# Patient Record
Sex: Female | Born: 1990 | Race: Asian | Hispanic: No | Marital: Single
Health system: Southern US, Community
[De-identification: ages and names within clinical notes are randomized; demographics above are authoritative.]

---
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
## 2018-01-13 ENCOUNTER — Encounter (HOSPITAL_BASED_OUTPATIENT_CLINIC_OR_DEPARTMENT_OTHER): Payer: Self-pay | Admitting: Emergency Medicine

## 2018-01-13 ENCOUNTER — Other Ambulatory Visit: Payer: Self-pay

## 2018-01-13 ENCOUNTER — Emergency Department (HOSPITAL_BASED_OUTPATIENT_CLINIC_OR_DEPARTMENT_OTHER)
Admission: EM | Admit: 2018-01-13 | Discharge: 2018-01-13 | Disposition: A | Discharge status: Home / Self Care | Payer: 59 | Attending: Emergency Medicine | Admitting: Emergency Medicine

## 2018-01-13 DIAGNOSIS — R51 Headache: Secondary | ICD-10-CM | POA: Diagnosis present

## 2018-01-13 DIAGNOSIS — G971 Other reaction to spinal and lumbar puncture: Secondary | ICD-10-CM | POA: Insufficient documentation

## 2018-01-13 LAB — CBC WITH DIFFERENTIAL/PLATELET
BASOS PCT: 0 %
Basophils Absolute: 0 10*3/uL (ref 0.0–0.1)
EOS ABS: 0.1 10*3/uL (ref 0.0–0.7)
EOS PCT: 1 %
HCT: 38.9 % (ref 36.0–46.0)
HEMOGLOBIN: 12.7 g/dL (ref 12.0–15.0)
LYMPHS PCT: 24 %
Lymphs Abs: 1.9 10*3/uL (ref 0.7–4.0)
MCH: 29.4 pg (ref 26.0–34.0)
MCHC: 32.6 g/dL (ref 30.0–36.0)
MCV: 90 fL (ref 78.0–100.0)
Monocytes Absolute: 0.7 10*3/uL (ref 0.1–1.0)
Monocytes Relative: 9 %
NEUTROS PCT: 66 %
Neutro Abs: 5.2 10*3/uL (ref 1.7–7.7)
Platelets: 268 10*3/uL (ref 150–400)
RBC: 4.32 MIL/uL (ref 3.87–5.11)
RDW: 14 % (ref 11.5–15.5)
WBC: 7.9 10*3/uL (ref 4.0–10.5)

## 2018-01-13 LAB — BASIC METABOLIC PANEL
Anion gap: 6 (ref 5–15)
BUN: 13 mg/dL (ref 6–20)
CHLORIDE: 103 mmol/L (ref 101–111)
CO2: 29 mmol/L (ref 22–32)
Calcium: 8.6 mg/dL — ABNORMAL LOW (ref 8.9–10.3)
Creatinine, Ser: 0.79 mg/dL (ref 0.44–1.00)
GFR calc Af Amer: >60 mL/min (ref >60)
GFR calc non Af Amer: >60 mL/min (ref >60)
Glucose, Bld: 105 mg/dL — ABNORMAL HIGH (ref 65–99)
POTASSIUM: 3.8 mmol/L (ref 3.5–5.1)
SODIUM: 138 mmol/L (ref 135–145)

## 2018-01-13 MED ORDER — METOCLOPRAMIDE HCL 5 MG/ML IJ SOLN
10.0000 mg | Freq: Once | INTRAMUSCULAR | Status: AC
  Administered 2018-01-13: 10 mg via INTRAVENOUS
  Filled 2018-01-13: qty 2

## 2018-01-13 MED ORDER — KETOROLAC TROMETHAMINE 30 MG/ML IJ SOLN
15.0000 mg | Freq: Once | INTRAMUSCULAR | Status: AC
  Administered 2018-01-13: 15 mg via INTRAVENOUS
  Filled 2018-01-13: qty 1

## 2018-01-13 MED ORDER — DEXAMETHASONE SODIUM PHOSPHATE 10 MG/ML IJ SOLN
10.0000 mg | Freq: Once | INTRAMUSCULAR | Status: AC
  Administered 2018-01-13: 10 mg via INTRAVENOUS
  Filled 2018-01-13: qty 1

## 2018-01-13 MED ORDER — SODIUM CHLORIDE 0.9 % IV BOLUS (SEPSIS)
1000.0000 mL | Freq: Once | INTRAVENOUS | Status: AC
  Administered 2018-01-13: 1000 mL via INTRAVENOUS

## 2018-01-13 MED ORDER — DIPHENHYDRAMINE HCL 50 MG/ML IJ SOLN
25.0000 mg | Freq: Once | INTRAMUSCULAR | Status: AC
  Administered 2018-01-13: 25 mg via INTRAVENOUS
  Filled 2018-01-13: qty 1

## 2018-01-13 NOTE — Discharge Instructions (Signed)
Lay flat to keep headache controlled.  Take aleve or ibuprofen.  Drink plenty of fluids and caffiene.

## 2018-01-13 NOTE — ED Provider Notes (Signed)
[facility] EMERGENCY DEPARTMENT Provider Note   CSN: [id number] Arrival date & time: 01/13/18  1410     History   Chief Complaint Chief Complaint  Patient presents with  . Neck Pain  . Headache    HPI Meagan Kirk is a [age] y.o. female.  27 year old female with history of recently dx with viral meningitis after neg CSF by culture presenting with persistent HA.  She was discharged Wed and states she felt bad when she left and may feel worse now in the last 2 days she has been home.  She states that the HA is excruciating when standing and trying to walk.  She states it is a throbbing pressure and feels like her head is going to pop off.  Better if she lays down.  She denies any fever, unusual sensation in arms or legs but with standing she had some muffled hearing in her right ear today.  The hearing goes to normal with lying down.  Patient had all vaccines as a child.  She denies any abdominal pain.   The history is provided by the patient.    History reviewed. No pertinent past medical history.  There are no active problems to display for this patient.    OB History    No data available       Home Medications    Prior to Admission medications   Not on File    Family History History reviewed. No pertinent family history.  Social History Social History   Tobacco Use  . Smoking status: Not on file  Substance Use Topics  . Alcohol use: Not on file  . Drug use: Not on file     Allergies   Patient has no known allergies.   Review of Systems Review of Systems  All other systems reviewed and are negative.    Physical Exam Updated Vital Signs BP (!) 148/117   Pulse 76   Temp 98.3 F (36.8 C) (Oral)   Resp 20   Ht 5\' 6"  (1.676 m)   Wt 59 kg (130 lb)   LMP 12/30/2017 (Approximate)   SpO2 99%   BMI 20.98 kg/m   Physical Exam  Constitutional: She is oriented to person, place, and time. She appears well-developed and well-nourished.  No distress.  Pt lying flat and appear comfortable  HENT:  Head: Normocephalic and atraumatic.  Right Ear: Tympanic membrane normal.  Left Ear: Tympanic membrane normal.  Mouth/Throat: Oropharynx is clear and moist.  Eyes: Conjunctivae and EOM are normal. Pupils are equal, round, and reactive to light.  No nystagmus  Neck: Normal range of motion. Neck supple.  Some discomfort with neck flexion  Cardiovascular: Normal rate, regular rhythm and intact distal pulses.  No murmur heard. Pulmonary/Chest: Effort normal and breath sounds normal. No respiratory distress. She has no wheezes. She has no rales.  Abdominal: Soft. She exhibits no distension. There is no tenderness. There is no rebound and no guarding.  Musculoskeletal: Normal range of motion. She exhibits no edema or tenderness.  Neurological: She is alert and oriented to person, place, and time. She has normal strength. No cranial nerve deficit or sensory deficit. Coordination normal.  Skin: Skin is warm and dry. No rash noted. No erythema.  Psychiatric: She has a normal mood and affect. Her behavior is normal.  Nursing note and vitals reviewed.    ED Treatments / Results  Labs (all labs ordered are listed, but only abnormal results are displayed) Labs Reviewed  BASIC METABOLIC PANEL - Abnormal; Notable for the following components:      Result Value   Glucose, Bld 105 (*)    Calcium 8.6 (*)    All other components within normal limits  CBC WITH DIFFERENTIAL/PLATELET    EKG  EKG Interpretation None       Radiology No results found.  Procedures Procedures (including critical care time)  Medications Ordered in ED Medications  sodium chloride 0.9 % bolus 1,000 mL (not administered)  metoCLOPramide (REGLAN) injection 10 mg (not administered)  dexamethasone (DECADRON) injection 10 mg (not administered)  diphenhydrAMINE (BENADRYL) injection 25 mg (not administered)  ketorolac (TORADOL) 30 MG/ML injection 15 mg (not  administered)     Initial Impression / Assessment and Plan / ED Course  I have reviewed the triage vital signs and the nursing notes.  Pertinent labs & imaging results that were available during my care of the patient were reviewed by me and considered in my medical decision making (see chart for details).     Patient presenting today after recent dx of viral meningitis on Monday by LP with neg culture results.  Pt d/ced home on wed but states headache was as severe or worse when going home.  D/ced all abx.  Pt HA is very positional and worse with standing.  Pt had CT while at [facility] which was wnl.  She denies any fever or vomiting at this time.  As long as lying down she doesn't feel too bad but standing produces an excrutiating HA.  Concern at this time for post-LP headache.  She has normal neuro exam and denies any infectious sx at this time.  Pt VS with HTN however will recheck as concern this is an error.  Pt given IVF, headache cocktail and will re-eval.  Pt feeling a little better after IV therapy.   Feel she may benefit from a blood patch however unable to schedule that on a Friday night.  Pt is able to ambulate and non-toxic.  Discussed with Dr. Lacy Duverney with radiology and pt given a number to f/u if headache continues to be severe by Monday for blood patch.  Labs wnl and repeat BP wnl.  Final Clinical Impressions(s) / ED Diagnoses   Final diagnoses:  Post lumbar puncture headache    ED Discharge Orders    None       Gwyneth Sprout, MD 01/13/18 1711

## 2018-01-13 NOTE — ED Notes (Signed)
Pt verbalizes understanding of d/c instructions and denies any further need at this time. 

## 2018-01-13 NOTE — ED Notes (Signed)
Pt diagnosed with meningitis 4 days ago, has had a bad headache since prior to her admission to [facility] in [place], and the headache is worse with sitting upright.  Pt took two hydrocodone and 400 mg ibuprofen at 1000 today without relief.

## 2018-01-13 NOTE — ED Triage Notes (Signed)
Reports diagnosed with meningitis.  [facility].  Initially on abx but was told it is viral so all abx discontinued.  Presents to er for worsening head and neck pain.

## 2018-01-16 ENCOUNTER — Ambulatory Visit
Admission: RE | Admit: 2018-01-16 | Discharge: 2018-01-16 | Disposition: A | Discharge status: Home / Self Care | Payer: 59 | Source: Ambulatory Visit | Attending: Emergency Medicine | Admitting: Emergency Medicine

## 2018-01-16 ENCOUNTER — Other Ambulatory Visit: Payer: Self-pay | Admitting: Emergency Medicine

## 2018-01-16 DIAGNOSIS — G971 Other reaction to spinal and lumbar puncture: Secondary | ICD-10-CM

## 2018-01-16 MED ORDER — IOPAMIDOL (ISOVUE-M 200) INJECTION 41%
1.0000 mL | Freq: Once | INTRAMUSCULAR | Status: AC
  Administered 2018-01-16: 1 mL via EPIDURAL

## 2018-01-16 NOTE — Discharge Instructions (Signed)

## 2018-07-16 IMAGING — XA Imaging study
2 series · 2 of 2 positions shown · non-contrast
Comparison: none

CLINICAL DATA: Positional headaches following lumbar puncture at
[facility]OS[facility]] 1 week ago.

[Series 1: ortho standard · 1 of 1 slices shown (1 of 2)]
[im 1/1]
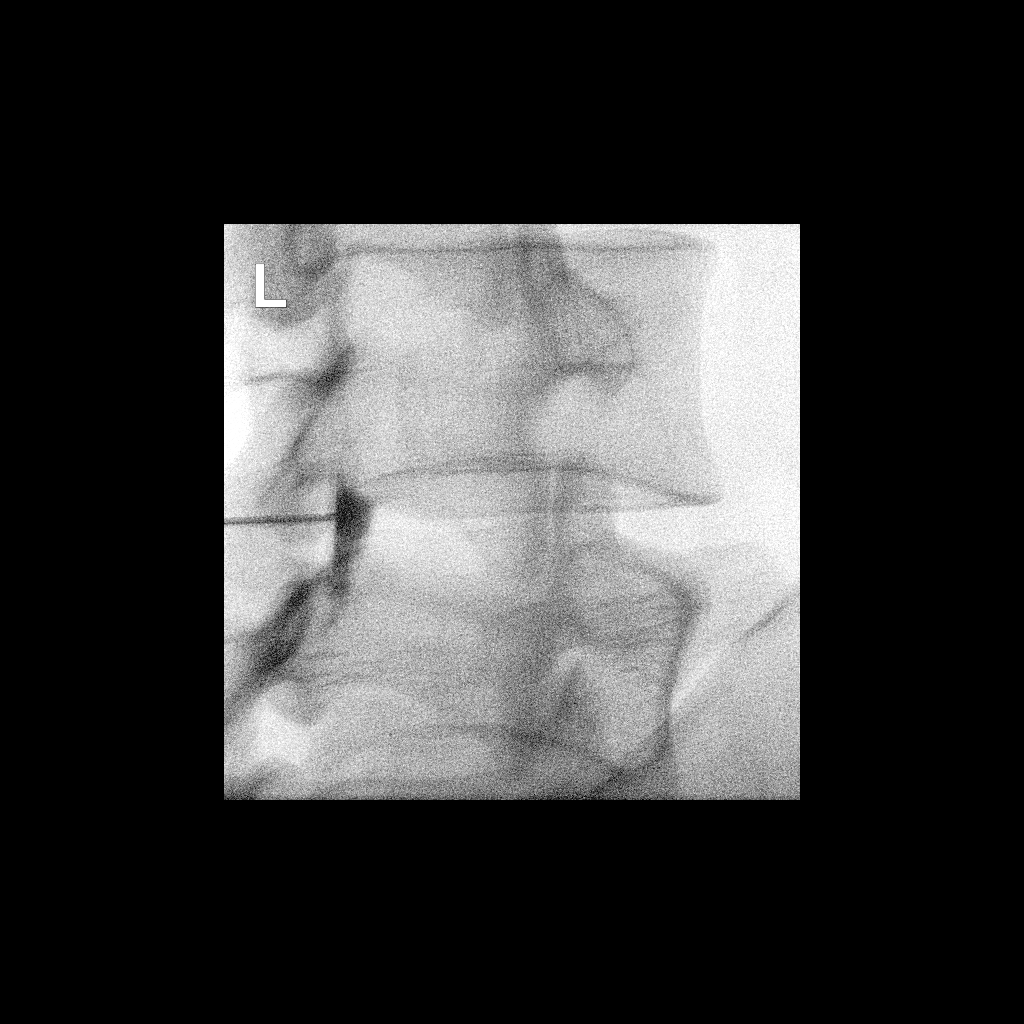

[Series 2: ortho standard · 1 of 1 slices shown (2 of 2)]
[im 1/1]
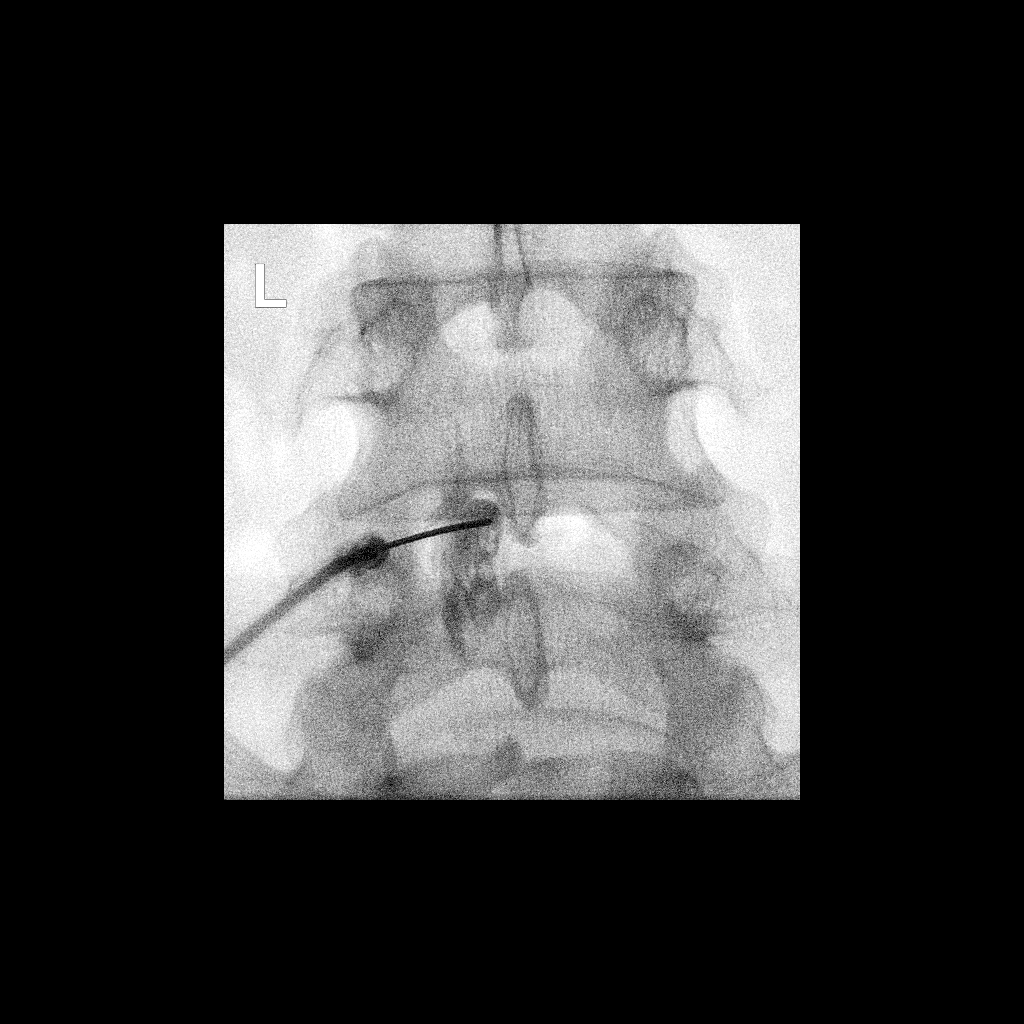

[2 of 2 positions shown; findings below may reference images not displayed]

FLUOROSCOPY TIME:  Radiation Exposure Index (as provided by the
fluoroscopic device): 5.27 uGy*m2

Fluoroscopy Time:  14 seconds

Number of Acquired Images:  0

PROCEDURE:
EPIDURAL BLOOD PATCH

The procedure, risks, benefits, and alternatives were explained to
the patient. Questions regarding the procedure were encouraged and
answered. The patient understands and consents to the procedure.

15 cc of blood were withdrawn from the patient's antecubital fossa.
An epidural approach was taken on left at L4-5 using a 20 gauge
epidural needle. Epidural positioning was confirmed by injecting a
small amount of Isovue-M 200. There was no vascular communication.
15 cc of the patient's blood was slowly injected into the epidural
space in this location. The procedure was well-tolerated and she was
discharged in good condition with instructions to lie down for
additional day.

COMPLICATIONS:
None
IMPRESSION: Lumbar epidural blood patch on the left at L4-5.

## 2022-08-30 ENCOUNTER — Ambulatory Visit (HOSPITAL_COMMUNITY)
Admission: EM | Admit: 2022-08-30 | Discharge: 2022-08-30 | Disposition: A | Discharge status: Home / Self Care | Payer: No Payment, Other | Attending: Family Medicine | Admitting: Family Medicine

## 2022-08-30 DIAGNOSIS — R413 Other amnesia: Secondary | ICD-10-CM | POA: Insufficient documentation

## 2022-08-30 DIAGNOSIS — F4323 Adjustment disorder with mixed anxiety and depressed mood: Secondary | ICD-10-CM | POA: Insufficient documentation

## 2022-08-30 NOTE — Discharge Instructions (Addendum)
Therapy Walk-in Hours  Monday-Wednesday: 8 AM until slots are full  Friday: 1 PM to 4 PM  For Monday to Wednesday, it is recommended that patients arrive between 7:30 AM and 7:45 AM because patients will be seen in the order of arrival.  For Friday, we ask that patients arrive between 12 PM to 12:30 PM.  Go to the second floor on arrival and check in.  **Availability is limited; therefore, patients may not be seen on the same day.**  Medication management walk-ins:  Monday to Friday: 8 AM to 11 AM.  It is recommended that patients arrive by 7:30 AM to 7:45 AM because patients will be seen in the order of arrival.  Go to the second floor on arrival and check in.  **Availability is limited; therefore, patients may not be seen on the same day.**

## 2022-08-30 NOTE — ED Provider Notes (Cosign Needed Addendum)
Behavioral Health Urgent Care Medical Screening Exam  Patient Name: Beverley Allender MRN: [id number] Date of Evaluation: 08/30/22 Chief Complaint:   Chief Complaint  Patient presents with   Depression  Diagnosis: Adjustment disorder with mixed anxiety and depressed mood  Final diagnoses:  Adjustment disorder with mixed anxiety and depressed mood   History of Present illness: Naarah Borgerding is a 31 y.o. female.   Weber Cooks 31 y.o., female patient presented voluntarily to [facility] as a walk in  accompanied by her father with complaints of poor memory, and feeling unsettled since quitting her job and moving back home with parents.    Weber Cooks, 36 y.o., female patient seen face to face by this provider, consulted with Dr. Lucianne Muss; and chart reviewed on 08/30/22.  On evaluation Kingsley Herandez reports a mental health history significant for ADHD, depression, and social anxiety. Patient presents today reporting a problem with loss of memory dating back to January 2023. She reports a fall after slipping on her kitchen floor which occurred in January and she attributes the fall to her poor  memory. She fell landing on the left side of face and did sustain a loss of consciousness nor did she require any medical follow-up after the fall. She subsequently abruptly quit her job in May and two week ago moved from [place] in which her parents. She states " I enjoyed my job and no one wanted me to leave, but I felt like it was too much pressure and too demanding, so I quit." She doesn't like living with her parents, but currently have limited resources. She is actively looking for a new job and has a goal of moving out of parents home by January. She is interested in establishing with a therapist as therapy has helped her in the past. She is currently uninsured although but routinely follows up with  her PCP, Dr. Donovan Kail, MD  for management of citalopram for depression and  anxiety and Adderall for ADHD. She denies SI/HI/AH/VH and any prior suicidal attempts or prior mental health admissions. She endorses use of Delta 8 approximately 2-3 times per week. Denies any other illicit drug or alcohol use. Endorses parental support and a few friends that reside in [place].  During evaluation Nelma Phagan is sitting upright in no acute distress.  She is alert, oriented x 4, calm, cooperative and attentive. Her mood is anxious, with a congruent affect.   She has normal speech, and behavior.  Objectively there is no evidence of psychosis/mania or delusional thinking.  Patient is able to converse coherently, goal directed thoughts, no distractibility, or pre-occupation.  She also denies suicidal/self-harm/homicidal ideation, psychosis, and paranoia.  Patient answered question appropriately.  Patient is able to contract for safety.  Psychiatric Specialty Exam  Presentation  General Appearance:Appropriate for Environment  Eye Contact:Fair  Speech:Normal Rate  Speech Volume:Normal  Handedness:No data recorded  Mood and Affect  Mood:Anxious  Affect:Congruent   Thought Process  Thought Processes:Goal Directed  Descriptions of Associations:No data recorded Orientation:Full (Time, Place and Person)  Thought Content:WDL    Hallucinations:None  Ideas of Reference:None  Suicidal Thoughts:No  Homicidal Thoughts:No   Sensorium  Memory:Immediate Good; Recent Good; Remote Good  Judgment:Good  Insight:Fair   Executive Functions  Concentration:Good  Attention Span:Good  Recall:Good  Fund of Knowledge:Good  Language:No data recorded  Psychomotor Activity  Psychomotor Activity:Normal   Assets  Assets:Communication Skills; Housing; Physical Health; Leisure Time; Social Support; Desire for Improvement   Sleep  Sleep:Fair  Number  of hours: 5   No data recorded  Physical Exam: Physical Exam Constitutional:      Appearance: Normal  appearance.  HENT:     Head: Normocephalic and atraumatic.  Eyes:     Extraocular Movements: Extraocular movements intact.     Conjunctiva/sclera: Conjunctivae normal.     Pupils: Pupils are equal, round, and reactive to light.  Musculoskeletal:     Cervical back: Normal range of motion.  Skin:    Capillary Refill: Capillary refill takes less than 2 seconds.  Neurological:     General: No focal deficit present.     Mental Status: She is alert.  Psychiatric:        Attention and Perception: Attention normal.        Mood and Affect: Mood is anxious.        Speech: Speech normal.        Behavior: Behavior is cooperative.        Thought Content: Thought content normal.        Judgment: Judgment normal.    Review of Systems  Constitutional: Negative.   Skin: Negative.   Neurological: Negative.   Psychiatric/Behavioral:  Positive for depression and memory loss. Negative for hallucinations, substance abuse and suicidal ideas. The patient is nervous/anxious. The patient does not have insomnia.    Blood pressure 131/87, pulse 93, temperature 98.5 F (36.9 C), temperature source Oral, resp. rate 16, height 5\' 6"  (1.676 m), weight 113 lb (51.3 kg), SpO2 100 %. Body mass index is 18.24 kg/m.  Musculoskeletal: Strength & Muscle Tone: within normal limits Gait & Station: normal Patient leans: N/A   [place] [facility] Discharge Disposition for Follow up and Recommendations: Based on my evaluation the patient does not appear to have an emergency medical condition and can be discharged with resources and follow up care in outpatient services for Individual Therapy and [place], FNP 08/30/2022, 6:11 PM

## 2022-08-30 NOTE — BH Assessment (Signed)
Pt reports she fell in January and since then she can not remember what she has been doing for the past 9 months. Patient was living in [place] and reports she left her job in May, moved in with her parents this month and is having worsening depression. Pt reports "hysterical crying" every night since moving in with her parents. Pt reports feeling "pressured" to get a job, move into her own place, get health insurance and take care of her mental health. Pt has been taking citalopram since college and she was receiving therapy services with [facility], however she is without outpatient services currently. Pt denies SI, HI, AVH and substance use.

## 2022-08-31 ENCOUNTER — Telehealth (HOSPITAL_COMMUNITY): Payer: Self-pay | Admitting: General Practice

## 2022-08-31 NOTE — BH Assessment (Signed)
Care Management - [facility] Follow Up Discharges  ° °Writer attempted to make contact with patient today and was unsuccessful.  Writer left a HIPPA compliant voice message.  ° °Per chart review, patient was provided with outpatient resources. ° °

## 2022-09-17 ENCOUNTER — Encounter (HOSPITAL_COMMUNITY): Payer: Self-pay | Admitting: Psychiatry

## 2022-09-17 ENCOUNTER — Ambulatory Visit (INDEPENDENT_AMBULATORY_CARE_PROVIDER_SITE_OTHER): Payer: No Payment, Other | Admitting: Psychiatry

## 2022-09-17 DIAGNOSIS — F411 Generalized anxiety disorder: Secondary | ICD-10-CM | POA: Diagnosis not present

## 2022-09-17 DIAGNOSIS — F32A Depression, unspecified: Secondary | ICD-10-CM

## 2022-09-17 MED ORDER — HYDROXYZINE HCL 10 MG PO TABS: 10.0000 mg | ORAL_TABLET | Freq: Three times a day (TID) | ORAL | 3 refills | Status: DC | PRN

## 2022-09-17 NOTE — Progress Notes (Signed)
Psychiatric Initial Adult Assessment  Virtual Visit via Video Note  I connected with Meagan Kirk on 09/17/22 at  8:00 AM EDT by a video enabled telemedicine application and verified that I am speaking with the correct person using two identifiers.  Location: Patient: Home Provider: Clinic   I discussed the limitations of evaluation and management by telemedicine and the availability of in person appointments. The patient expressed understanding and agreed to proceed.  I provided 40 minutes of non-face-to-face time during this encounter.   Patient Identification: Meagan Kirk MRN:  [id number] Date of Evaluation:  09/17/2022 Referral Source: [facility] Chief Complaint:  "I have been having problems sleeping" Visit Diagnosis:    ICD-10-CM   1. Generalized anxiety disorder  F41.1 hydrOXYzine (ATARAX) 10 MG tablet    2. Mild depression  F32.A       History of Present Illness:  31 year old female seen today for initial psychiatric evaluation.  She was recently seen at [facility] on 9/18 where she presented with symptoms of adjustment disorder.  She has a psychiatric history of adjustment disorder, anxiety, depression, and ADHD. She is currently managed on citalopram 40 mg and Adderall immediate release 30 mg twice daily mg. She notes her medications are somewhat effective in manage her psychiatric conditions.   Today she was well-groomed, pleasant, cooperative, engaged in conversation, and maintained eye contact.  She informed Clinical research associate that she has been having problems sleeping.  She notes that she sleeps approximately 4 hours nightly.  To cope with poor sleep patient notes that she utilize CBD products.  Patient notes that her lack of sleep is due to life stressors.  She reports in May she left her job because it was too demanding and now lives with her parents.  Patient notes that she worries about finding a new job and getting into the groove of where she once was.  She notes that she  worries about her finances and health as well.  Patient notes in social settings she becomes really anxious.  Today provider conducted a GAD-7 and patient scored an 18.  Provider also conducted PHQ-9 and patient scored 11.  She endorsed reduced appetite and notes that she is lost 15 pounds in the last 3 months.  Today she denies SI/HI/AVH, mania, or paranoia.  Patient informed writer that in January she fell and hit her head.  Since this time she notes that she has been having problems with her memory.  Provider tested patient's current memory and orientation and she was alert and oriented x4.  Patient notes at times she forgets the days of the week.  Provider informed patient that lack of sleep and poor nutrition can contribute to poor concentration and memory.  She endorsed understanding and agreed.  Provider informed patient that if memory does not improve with improved sleep and nutrition she can be referred to neurology.  She endorsed understanding and agreed.  Provider discussed Hetlioz, trazodone, and hydroxyzine to help manage sleep/anxiety.  Patient notes that she prefers hydroxyzine and is agreeable to starting hydroxyzine 10 mg 3 times daily to help manage sleep.  She will continue citalopram and Adderall as prescribed.  These medications will continue to be prescribed by her PCP. Potential side effects of medication and risks vs benefits of treatment vs non-treatment were explained and discussed. All questions were answered.    Associated Signs/Symptoms: Depression Symptoms:  depressed mood, insomnia, feelings of worthlessness/guilt, difficulty concentrating, impaired memory, anxiety, loss of energy/fatigue, disturbed sleep, weight loss, decreased appetite, (  Hypo) Manic Symptoms:  Elevated Mood, Irritable Mood, Anxiety Symptoms:  Excessive Worry, Psychotic Symptoms:   Denies PTSD Symptoms: NA  Past Psychiatric History: Na  Previous Psychotropic Medications:  Citalopram,  Adderall, Prozac, Zoloft  Substance Abuse History in the last 12 months:  No.  Consequences of Substance Abuse: NA  Past Medical History: History reviewed. No pertinent past medical history.   Family Psychiatric History: Denies  Family History: History reviewed. No pertinent family history.  Social History:   Social History   Socioeconomic History   Marital status: Single    Spouse name: Not on file   Number of children: Not on file   Years of education: Not on file   Highest education level: Not on file  Occupational History   Not on file  Tobacco Use   Smoking status: Not on file   Smokeless tobacco: Not on file  Substance and Sexual Activity   Alcohol use: Not on file   Drug use: Not on file   Sexual activity: Not on file  Other Topics Concern   Not on file  Social History Narrative   Not on file   Social Determinants of Health   Financial Resource Strain: Not on file  Food Insecurity: Not on file  Transportation Needs: Not on file  Physical Activity: Not on file  Stress: Not on file  Social Connections: Not on file    Additional Social History: Patient resides in [place] with her parents. She is single and has no children. She uses CBD but denies illegal drug use, tobacco, or substance use.  Allergies:  No Known Allergies  Metabolic Disorder Labs: No results found for: "HGBA1C", "MPG" No results found for: "PROLACTIN" No results found for: "CHOL", "TRIG", "HDL", "CHOLHDL", "VLDL", "LDLCALC" No results found for: "TSH"  Therapeutic Level Labs: No results found for: "LITHIUM" No results found for: "CBMZ" No results found for: "VALPROATE"  Current Medications: Current Outpatient Medications  Medication Sig Dispense Refill   hydrOXYzine (ATARAX) 10 MG tablet Take 1 tablet (10 mg total) by mouth 3 (three) times daily as needed. 90 tablet 3   No current facility-administered medications for this visit.    Musculoskeletal: Strength & Muscle Tone:  within normal limits and Telehealth visit Gait & Station: normal, Telehealth visit Patient leans: N/A  Psychiatric Specialty Exam: Review of Systems  There were no vitals taken for this visit.There is no height or weight on file to calculate BMI.  General Appearance: Well Groomed  Eye Contact:  Good  Speech:  Clear and Coherent and Normal Rate  Volume:  Normal  Mood:  Full (Time, Place, and Person)  Affect:  Appropriate  Thought Process:  Coherent, Goal Directed, and Linear  Orientation:  Full (Time, Place, and Person)  Thought Content:  WDL and Logical  Suicidal Thoughts:  No  Homicidal Thoughts:  No  Memory:  Immediate;   Fair Recent;   Fair Remote;   Fair  Judgement:  Good  Insight:  Good  Psychomotor Activity:  Normal  Concentration:  Concentration: Good and Attention Span: Good  Recall:  Good  Fund of Knowledge:Good  Language: Good  Akathisia:  No  Handed:  Ambidextrous  AIMS (if indicated):  not done  Assets:  Communication Skills Desire for Improvement Financial Resources/Insurance Housing Leisure Time Physical Health Social Support  ADL's:  Intact  Cognition: WNL  Sleep:  Poor   Screenings: GAD-7    Flowsheet Row Office Visit from [facility]/17/20[facility] in [facility]  Total GAD-7 Score 18      PHQ2-9    [place] Office Visit from 09/17/2022 in [facility]  PHQ-2 Total Score 4  PHQ-9 Total Score 11       Assessment and Plan: Patient endorses symptoms of anxiety, depression, and insomnia.  Today she is agreeable to starting hydroxyzine 10 mg 3 times daily to help manage anxiety and sleep.  Patient instructed to take 2 hydroxyzine nightly to help with sleep.  She endorsed understanding and agreed.  She will continue citalopram and Adderall as prescribed.  Adderall and citalopram will continue to be prescribed by her PCP.  1. Generalized anxiety disorder  Start- hydrOXYzine (ATARAX) 10 MG tablet; Take  1 tablet (10 mg total) by mouth 3 (three) times daily as needed.  Dispense: 90 tablet; Refill: 3  2. Mild depression    Collaboration of Care: Other provider involved in patient's care AEB PCP  Patient/Guardian was advised Release of Information must be obtained prior to any record release in order to collaborate their care with an outside provider. Patient/Guardian was advised if they have not already done so to contact the registration department to sign all necessary forms in order for [place] to release information regarding their care.   Consent: Patient/Guardian gives verbal consent for treatment and assignment of benefits for services provided during this visit. Patient/Guardian expressed understanding and agreed to proceed.   Follow-up in 3 months  Salley Slaughter, NP 10/6/20238:38 AM

## 2022-10-08 ENCOUNTER — Ambulatory Visit (INDEPENDENT_AMBULATORY_CARE_PROVIDER_SITE_OTHER): Payer: No Payment, Other | Admitting: Licensed Clinical Social Worker

## 2022-10-08 DIAGNOSIS — F411 Generalized anxiety disorder: Secondary | ICD-10-CM | POA: Diagnosis not present

## 2022-10-10 NOTE — Progress Notes (Signed)
Comprehensive Clinical Assessment (CCA) Note  10/08/2022 Ceretha Lenth [id number]  Chief Complaint:  Chief Complaint  Patient presents with   Anxiety    Laronda reports her primary anxiety triggers are surrounding her not having a job and looking for employment and feeling pressured by her mother.   Visit Diagnosis: Generalized Anxiety Disorder    CCA Screening, Triage and Referral (STR)  Patient Reported Information How did you hear about [place]? Family/Friend  Referral name: No data recorded Referral phone number: No data recorded  Whom do you see for routine medical problems? No data recorded Practice/Facility Name: No data recorded Practice/Facility Phone Number: No data recorded Name of Contact: No data recorded Contact Number: No data recorded Contact Fax Number: No data recorded Prescriber Name: No data recorded Prescriber Address (if known): No data recorded  What Is the Reason for Your Visit/Call Today? Worsening depression past few months. Denies SI, HI, AVH  How Long Has This Been Causing You Problems? 1-6 months  What Do You Feel Would Help You the Most Today? Treatment for Depression or other mood problem   Have You Recently Been in Any Inpatient Treatment (Hospital/Detox/Crisis Center/28-Day Program)? No data recorded Name/Location of Program/Hospital:No data recorded How Long Were You There? No data recorded When Were You Discharged? No data recorded  Have You Ever Received Services From [facility] Before? No data recorded Who Do You See at [facility]? No data recorded  Have You Recently Had Any Thoughts About Hurting Yourself? No  Are You Planning to Commit Suicide/Harm Yourself At This time? No   Have you Recently Had Thoughts About [place]? No  Explanation: No data recorded  Have You Used Any Alcohol or Drugs in the Past 24 Hours? No  How Long Ago Did You Use Drugs or Alcohol? No data recorded What Did You Use and How Much? No  data recorded  Do You Currently Have a Therapist/Psychiatrist? No data recorded Name of Therapist/Psychiatrist: No data recorded  Have You Been Recently Discharged From Any Office Practice or Programs? No data recorded Explanation of Discharge From Practice/Program: No data recorded    CCA Screening Triage Referral Assessment Type of Contact: No data recorded Is this Initial or Reassessment? No data recorded Date Telepsych consult ordered in [facility]:  No data recorded Time Telepsych consult ordered in [facility]:  No data recorded  Patient Reported Information Reviewed? No data recorded Patient Left Without Being Seen? No data recorded Reason for Not Completing Assessment: No data recorded  Collateral Involvement: No data recorded  Does Patient Have a [place]? No data recorded Name and Contact of Legal Guardian: No data recorded If Minor and Not Living with Parent(s), Who has Custody? No data recorded Is CPS involved or ever been involved? No data recorded Is APS involved or ever been involved? No data recorded  Patient Determined To Be At Risk for Harm To Self or Others Based on Review of Patient Reported Information or Presenting Complaint? No data recorded Method: No data recorded Availability of Means: No data recorded Intent: No data recorded Notification Required: No data recorded Additional Information for Danger to Others Potential: No data recorded Additional Comments for Danger to Others Potential: No data recorded Are There Guns or Other Weapons in Your Home? No data recorded Types of Guns/Weapons: No data recorded Are These Weapons Safely Secured?  No data recorded Who Could Verify You Are Able To Have These Secured: No data recorded Do You Have any Outstanding Charges, Pending Court Dates, Parole/Probation? No data recorded Contacted To Inform of Risk of Harm To Self or Others: No data recorded  Location of Assessment: No  data recorded  Does Patient Present under Involuntary Commitment? No data recorded IVC Papers Initial File Date: No data recorded  [place] of Residence: No data recorded  Patient Currently Receiving the Following Services: No data recorded  Determination of Need: Routine (7 days)   Options For Referral: Medication Management; Outpatient Therapy     CCA Biopsychosocial Intake/Chief Complaint:  Raiya reports that she has been dealing with increased anxiety since leaving job in May of 2023.  Current Symptoms/Problems: She reports that she has a lot of stress and overall unhappiness and interviewing for a new job. 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 She reports that she does not have any schedule or routine currently. Riddhi reports that keeping quite and to herself helps her not have any outbursts. She reports that she has tried to go to the gym and has a hard time getting the nerve to go to the gym. She reports that she has only been once on a 21 day pass. She reports arts and crafts is another coping skill she uses.   Patient Reported Schizophrenia/Schizoaffective Diagnosis in Past: No   Strengths: Good listener, takes great instructions, fast learner  Preferences: Ok with talking to a therapist once a week.  Abilities: Consulting, sales, talking to others   Type of Services Patient Feels are Needed: Indivudal therapy   Initial Clinical Notes/Concerns: No data recorded  Mental Health Symptoms Depression:   None   Duration of Depressive symptoms: No data recorded  Mania:   None   Anxiety:    Difficulty concentrating; Fatigue; Irritability; Sleep; Tension; Worrying   Psychosis:   None   Duration of Psychotic symptoms: No data recorded  Trauma:   None   Obsessions:   None   Compulsions:   None   Inattention:   None   Hyperactivity/Impulsivity:   None   Oppositional/Defiant Behaviors:   None   Emotional Irregularity:   Mood lability    Other Mood/Personality Symptoms:  No data recorded   Mental Status Exam Appearance and self-care  Stature:   Small   Weight:   Average weight   Clothing:   Casual   Grooming:   Normal   Cosmetic use:   None   Posture/gait:   Normal   Motor activity:   Not Remarkable   Sensorium  Attention:   Normal   Concentration:   Normal   Orientation:   X5   Recall/memory:   Normal   Affect and Mood  Affect:   Appropriate; Labile   Mood:   Irritable; Anxious   Relating  Eye contact:   Normal   Facial expression:   Responsive   Attitude toward examiner:   Cooperative   Thought and Language  Speech flow:  Clear and Coherent   Thought content:   Appropriate to Mood and Circumstances   Preoccupation:   None   Hallucinations:   None   Organization:  No data recorded  [employer] of Knowledge:   Good   Intelligence:   Average   Abstraction:   Normal   Judgement:   Fair   Reality Testing:   Realistic   Insight:   Good   Decision Making:  Normal   Social Functioning  Social Maturity:   Isolates   Social Judgement:   Normal   Stress  Stressors:   Museum/gallery curator; 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; Work   Coping Ability:   Programme researcher, broadcasting/film/video Deficits:   Interpersonal; Self-care; Communication   Supports:   Support needed; Family     Religion: Religion/Spirituality Are You A Religious Person?: No  Leisure/Recreation: Leisure / Recreation Do You Have Hobbies?: No  Exercise/Diet: Exercise/Diet Do You Exercise?: No Have You Gained or Lost A Significant Amount of Weight in the Past Six Months?: No Do You Follow a Special Diet?: No Do You Have Any Trouble Sleeping?: Yes Explanation of Sleeping Difficulties: falling asleep and staying asleep   CCA Employment/Education Employment/Work Situation: Employment / Work Situation Employment Situation: Unemployed Patient's Job has Been Impacted by Current Illness: No What is  the Longest Time Patient has Held a Job?: 5 years 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 Has Patient ever Been in the [employer]?: No  Education: Education Is Patient Currently Attending School?: No Did Teacher, adult education From [employer]?: Yes Did Physicist, medical?: Yes What Type of College Degree Do you Have?: Bachelors degree Did You Have An Individualized Education Program (IIEP): No Did You Have Any Difficulty At School?: No Patient's Education Has Been Impacted by Current Illness: No   CCA Family/Childhood History Family and Relationship History: Family history Marital status: Single Does patient have children?: No  Childhood History:  Childhood History By whom was/is the patient raised?: Both parents Additional childhood history information: Charolotte reports that she does not really remember her childhood. She reports that she does not care to remember.  Child/Adolescent Assessment:     CCA Substance Use Alcohol/Drug Use: Alcohol / Drug Use Prescriptions: Citalapram, Adderall, Birth control History of alcohol / drug use?: No history of alcohol / drug abuse Longest period of sobriety (when/how long): No past alcohol or drug abuse history Withdrawal Symptoms: None                         ASAM's:  Six Dimensions of Multidimensional Assessment  Dimension 1:  Acute Intoxication and/or Withdrawal Potential:  None    Dimension 2:  Biomedical Conditions and Complications:    None  Dimension 3:  Emotional, Behavioral, or Cognitive Conditions and Complications:   Anxiety  Dimension 4:  Readiness to Change:   Preparation  Dimension 5:  Relapse, Continued use, or Continued Problem Potential:   None  Dimension 6:  Recovery/Living Environment:   Supportive  ASAM Severity Score:    ASAM Recommended Level of Treatment:     Substance use Disorder (SUD)    Recommendations for Services/Supports/Treatments:    DSM5 Diagnoses: Patient Active Problem  List   Diagnosis Date Noted   Generalized anxiety disorder 09/17/2022   Mild depression 09/17/2022    Patient Centered Plan: Patient is on the following Treatment Plan(s):  Anxiety  Sephra is a 31 y/o [place] female presenting for assessment. She reports that she has a lot of stress and overall unhappiness and interviewing for a new job. 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 She reports that she does not have any schedule or routine currently. Toniyah reports that keeping quite and to herself helps her not have any outbursts. She reports that she has tried to go to the gym and has a hard time getting the nerve to go to the gym. She reports that she has only  been once on a 21 day pass. She reports arts and crafts is another coping skill she uses.  Tyechia is recommended for individual therapy and medication management. She is agreeable to this.  Referrals to Alternative Service(s): Referred to Alternative Service(s):   Place:   Date:   Time:    Referred to Alternative Service(s):   Place:   Date:   Time:    Referred to Alternative Service(s):   Place:   Date:   Time:    Referred to Alternative Service(s):   Place:   Date:   Time:      Collaboration of Care: Medication Management AEB    Patient/Guardian was advised Release of Information must be obtained prior to any record release in order to collaborate their care with an outside provider. Patient/Guardian was advised if they have not already done so to contact the registration department to sign all necessary forms in order for [place] to release information regarding their care.   Consent: Patient/Guardian gives verbal consent for treatment and assignment of benefits for services provided during this visit. Patient/Guardian expressed understanding and agreed to proceed.   Allyson Sabal, [facility]

## 2022-10-21 ENCOUNTER — Ambulatory Visit (HOSPITAL_COMMUNITY): Payer: No Payment, Other | Admitting: Licensed Clinical Social Worker

## 2022-12-14 ENCOUNTER — Encounter (HOSPITAL_COMMUNITY): Payer: Self-pay | Admitting: Student in an Organized Health Care Education/Training Program

## 2022-12-14 ENCOUNTER — Telehealth (INDEPENDENT_AMBULATORY_CARE_PROVIDER_SITE_OTHER): Payer: Medicaid Other | Admitting: Student in an Organized Health Care Education/Training Program

## 2022-12-14 DIAGNOSIS — F331 Major depressive disorder, recurrent, moderate: Secondary | ICD-10-CM | POA: Diagnosis not present

## 2022-12-14 DIAGNOSIS — F411 Generalized anxiety disorder: Secondary | ICD-10-CM

## 2022-12-14 MED ORDER — DULOXETINE HCL 30 MG PO CPEP: 30.0000 mg | ORAL_CAPSULE | Freq: Every day | ORAL | 0 refills | Status: AC

## 2022-12-14 MED ORDER — DULOXETINE HCL 60 MG PO CPEP: 60.0000 mg | ORAL_CAPSULE | Freq: Every day | ORAL | 2 refills | Status: DC

## 2022-12-14 MED ORDER — HYDROXYZINE HCL 10 MG PO TABS: 10.0000 mg | ORAL_TABLET | Freq: Three times a day (TID) | ORAL | 3 refills | Status: AC | PRN

## 2022-12-14 NOTE — Progress Notes (Signed)
BH MD/PA/NP OP Progress Note  12/14/2022 1:59 PM Meagan Kirk  MRN:  [id number]  Chief Complaint:  Chief Complaint  Patient presents with   Follow-up  Virtual Visit via Video Note  I connected with Weber Cooks on 12/14/22 at  1:30 PM EST by a video enabled telemedicine application and verified that I am speaking with the correct person using two identifiers.  Location: Patient: Home Provider: Office   I discussed the limitations of evaluation and management by telemedicine and the availability of in person appointments. The patient expressed understanding and agreed to proceed.  History of Present Illness: Meagan Kirk is a 32 yo patient with PPH of MDD, GAD, and ADHD. Patient received her Adderall 30mg  BID from her PCP and is prescribed Celexa 40mg  and Hydroxyzine 10mg  TID PRN.   Patient reports that she has been compliant with meds. Patient reports that she continues to feel flat and depressed, she does think that the hydroxyzine has been helping with sleep.  Patient reports that she is not able to take the hydroxyzine much during the day because it does sedate her.  Patient reports that she has been unemployed since last 04/2021 and has been dealing with significant anhedonia. Patient reports that she is dealing with a lot of hopelessness.  Patient reports that overall she feels her confidence is extremely low.  Patient reports that she still has an appetite and no big change in weight, she is trying to go to the gym.  Patient reports that going to the gym often takes great effort, and she will often find herself sitting outside in her car for a long period of time before finally making it in.  Patient denies SI but endorses feeling worthlessness. Patient denies HI and AVH.   Patient reports that she feels constantly on edge. Patient reports that before hydroxyzine this was also contributing to her poor sleep. Patient reports that she continues to feel very irritable and  sensitive.    Objectively, patient does endorse limited thought process, and struggles with her own expectations for herself and has a lot of "should" ideas and cognitive distortion.  Patient does not appear to meet critieria for mania or hypomania.     I discussed the assessment and treatment plan with the patient. The patient was provided an opportunity to ask questions and all were answered. The patient agreed with the plan and demonstrated an understanding of the instructions.   The patient was advised to call back or seek an in-person evaluation if the symptoms worsen or if the condition fails to improve as anticipated.  I provided 30 minutes of non-face-to-face time during this encounter.   , MD  Visit Diagnosis:    ICD-10-CM   1. Moderate episode of recurrent major depressive disorder (HCC)  F33.1 DULoxetine (CYMBALTA) 30 MG capsule    DULoxetine (CYMBALTA) 60 MG capsule    hydrOXYzine (ATARAX) 10 MG tablet    2. Generalized anxiety disorder  F41.1 DULoxetine (CYMBALTA) 30 MG capsule    DULoxetine (CYMBALTA) 60 MG capsule    hydrOXYzine (ATARAX) 10 MG tablet      Past Psychiatric History:   Hx of Zoloft, Prozac (liked it, pooped out)Wellbutrin (not helpful), buspar (not helpful)  Past Medical History: No past medical history on file. No past surgical history on file.  Family Psychiatric History: See initial visit  Family History: No family history on file.  Social History:  Social History   Socioeconomic History   Marital status: Single  Spouse name: Not on file   Number of children: Not on file   Years of education: Not on file   Highest education level: Not on file  Occupational History   Not on file  Tobacco Use   Smoking status: Not on file   Smokeless tobacco: Not on file  Substance and Sexual Activity   Alcohol use: Not on file   Drug use: Not on file   Sexual activity: Not on file  Other Topics Concern   Not on file  Social  History Narrative   Not on file   Social Determinants of Health   Financial Resource Strain: Not on file  Food Insecurity: Not on file  Transportation Needs: Not on file  Physical Activity: Not on file  Stress: Not on file  Social Connections: Not on file    Allergies: No Known Allergies  Metabolic Disorder Labs: No results found for: "HGBA1C", "MPG" No results found for: "PROLACTIN" No results found for: "CHOL", "TRIG", "HDL", "CHOLHDL", "VLDL", "LDLCALC" No results found for: "TSH"  Therapeutic Level Labs: No results found for: "LITHIUM" No results found for: "VALPROATE" No results found for: "CBMZ"  Current Medications: Current Outpatient Medications  Medication Sig Dispense Refill   DULoxetine (CYMBALTA) 30 MG capsule Take 1 capsule (30 mg total) by mouth daily. 7 capsule 0   DULoxetine (CYMBALTA) 60 MG capsule Take 1 capsule (60 mg total) by mouth daily. 30 capsule 2   hydrOXYzine (ATARAX) 10 MG tablet Take 1 tablet (10 mg total) by mouth 3 (three) times daily as needed. 90 tablet 3   No current facility-administered medications for this visit.     Musculoskeletal: defer  Psychiatric Specialty Exam: Review of Systems  There were no vitals taken for this visit.There is no height or weight on file to calculate BMI.  General Appearance: Casual  Eye Contact:  Good  Speech:  Clear and Coherent  Volume:  Normal  Mood:  Dysphoric  Affect:  Congruent  Thought Process:  Linear  Orientation:  Full (Time, Place, and Person)  Thought Content: Logical   Suicidal Thoughts:  No  Homicidal Thoughts:  No  Memory:  Immediate;   Good Recent;   Good  Judgement:  Fair  Insight:  Fair  Psychomotor Activity:  Normal  Concentration:  Concentration: Fair  Recall:  NA  Fund of Knowledge: Good  Language: Good  Akathisia:  No  Handed:    AIMS (if indicated): not done  Assets:  Communication Skills Desire for Improvement Housing Leisure Time Physical  Health Resilience Social Support  ADL's:  Intact  Cognition: WNL  Sleep:  Good   Screenings: GAD-7    Flowsheet Row Office Visit from 09/17/2022 in [facility]  Total GAD-7 Score 18      PHQ2-9    [place] Office Visit from 09/17/2022 in [place]  PHQ-2 Total Score 4  PHQ-9 Total Score 11      Flowsheet Row Counselor from 10/08/2022 in [place] No Risk        Assessment and Plan:  Terrianna Holsclaw is a 32 yo patient with PPH of MDD, GAD, and ADHD.  Based on assessment today patient continues to struggle with MDD and anxiety.  Patient has had multiple medication trials of SSRIs, where most have "pooped out" and this appears to be the issue at this time.  Patient has been on Celexa 40 mg for multiple years  and is endorsing need for another change.  Patient has no SNRI trials.  We will cross titrate patient over from Celexa to Cymbalta.  Patient overall endorsed compliance but has had history of adverse side effects and missing 1 dose of the medication, thus it is likely better to trial Cymbalta than Effexor.  Patient was educated on adverse side effects risk with Cymbalta.  Patient would benefit from therapy in the future however, at this time patient did not seem very competent in her abilities to maintain compliance with therapy.  Agree with patient to reassess at next appointment, patient would be a good resident therapy candidate.  MDD, recurrent, moderate GAD - Decrease Celexa to 20 mg x 7 days, start Cymbalta 30 mg daily - Discontinue Celexa after 7 days and increase Cymbalta to 60 mg daily -Continue hydroxyzine 10 mg nightly as needed  Follow-up in approximately 2 months   Collaboration of Care: Collaboration of Care:   Patient/Guardian was advised Release of Information must be obtained prior to any record release in order to collaborate their care  with an outside provider. Patient/Guardian was advised if they have not already done so to contact the registration department to sign all necessary forms in order for [place] to release information regarding their care.   Consent: Patient/Guardian gives verbal consent for treatment and assignment of benefits for services provided during this visit. Patient/Guardian expressed understanding and agreed to proceed.   PGY-3 Freida Busman, MD 12/14/2022, 1:59 PM

## 2022-12-14 NOTE — Patient Instructions (Signed)
Week 1: Decrease Celexa to 20mg   AND Start Cymbalta at 30mg   2. Week 2: Discontinue Celexa AND increase Cymbalta to 60mg 

## 2023-02-28 ENCOUNTER — Telehealth (INDEPENDENT_AMBULATORY_CARE_PROVIDER_SITE_OTHER): Payer: Medicaid Other | Admitting: Student in an Organized Health Care Education/Training Program

## 2023-02-28 ENCOUNTER — Encounter (HOSPITAL_COMMUNITY): Payer: Self-pay | Admitting: Student in an Organized Health Care Education/Training Program

## 2023-02-28 DIAGNOSIS — F411 Generalized anxiety disorder: Secondary | ICD-10-CM | POA: Diagnosis not present

## 2023-02-28 DIAGNOSIS — F331 Major depressive disorder, recurrent, moderate: Secondary | ICD-10-CM | POA: Diagnosis not present

## 2023-02-28 MED ORDER — DULOXETINE HCL 40 MG PO CPEP: 40.0000 mg | ORAL_CAPSULE | Freq: Every day | ORAL | 0 refills | Status: AC

## 2023-02-28 NOTE — Progress Notes (Signed)
[place] MD/PA/NP OP Progress Note  02/28/2023 4:43 PM Meagan Kirk  MRN:  [id number]  Chief Complaint:  Chief Complaint  Patient presents with   Follow-up   Virtual Visit via Video Note  I connected with Meagan Kirk on 02/28/23 at  3:30 PM EDT by a video enabled telemedicine application and verified that I am speaking with the correct person using two identifiers.  Location: Patient: Home Provider: Office   I discussed the limitations of evaluation and management by telemedicine and the availability of in person appointments. The patient expressed understanding and agreed to proceed.  History of Present Illness:  Meagan Kirk is a 32 yo patient with PPH of MDD, GAD, and ADHD.  Patient is currently prescribed the following medications:  Cymbalta 60 mg daily Adderall 30 mg twice daily-prescribed by PCP, last filled 12/30/2022 Hydroxyzine 10 mg 3 times daily as needed PCP- Dr. Julien Girt in [place]  Patient reports that she is doing "ok," she reports that she does not have a full-time job right now. Patient reports that her mood has been and been up and down the last few weeks. Patient reports that she has been having occasional moment of hope. Patient reports she is not sure the Cymbalta is doing much, but she does not think it is harmful. Patient reports that her PCP rx Lexapro because she wanted to give Cymbalta more time. Patient reports that her appetite is good and her sleep has been bed. Patient reports occasional passive SI, HI, and AVH. Patient endorses some guilt about small things, that appears to spend some time.   Patient reports that her anxiety is bit decreased, patient reports that her dad is currently in the hospital awaiting a heart translplant and she does not see him often he is at [facility]. Patient reports that he has been there over 1 month. Mom goes everyday. Patient is going to see him today.   Patient reports that she has not been taking her ful  Adderall dose, because she is not very busy. She does think it helps her get out o bed, do chorues, and complete gigs.   Patient reports she started journaling 1 week ago.    Patient feels like she is on autopilot.  Patient reports that she is on treinoin for her acne the last 2 months. Patient reports that she does not feel like her depression has worsened since being on the drug.    I discussed the assessment and treatment plan with the patient. The patient was provided an opportunity to ask questions and all were answered. The patient agreed with the plan and demonstrated an understanding of the instructions.   The patient was advised to call back or seek an in-person evaluation if the symptoms worsen or if the condition fails to improve as anticipated.  I provided 25 minutes of non-face-to-face time during this encounter.   Freida Busman, MD  Visit Diagnosis:    ICD-10-CM   1. Generalized anxiety disorder  F41.1 DULoxetine 40 MG CPEP    2. Moderate episode of recurrent major depressive disorder (HCC)  F33.1 DULoxetine 40 MG CPEP      Past Psychiatric History:  Hx of Zoloft, Prozac (liked it, pooped out)Wellbutrin (not helpful), buspar (not helpful)  Last visit: 12/2022-patient continued to endorse significant depressive symptoms, titrated off of Celexa and titrated onto Cymbalta 60 mg  Past Medical History: No past medical history on file. No past surgical history on file.  Family Psychiatric History: See initial  visit   Family History: No family history on file.  Social History:  Social History   Socioeconomic History   Marital status: Single    Spouse name: Not on file   Number of children: Not on file   Years of education: Not on file   Highest education level: Not on file  Occupational History   Not on file  Tobacco Use   Smoking status: Not on file   Smokeless tobacco: Not on file  Substance and Sexual Activity   Alcohol use: Not on file   Drug use: Not on  file   Sexual activity: Not on file  Other Topics Concern   Not on file  Social History Narrative   Not on file   Social Determinants of Health   Financial Resource Strain: Not on file  Food Insecurity: Not on file  Transportation Needs: Not on file  Physical Activity: Not on file  Stress: Not on file  Social Connections: Not on file    Allergies: No Known Allergies  Metabolic Disorder Labs: No results found for: "HGBA1C", "MPG" No results found for: "PROLACTIN" No results found for: "CHOL", "TRIG", "HDL", "CHOLHDL", "VLDL", "LDLCALC" No results found for: "TSH"  Therapeutic Level Labs: No results found for: "LITHIUM" No results found for: "VALPROATE" No results found for: "CBMZ"  Current Medications: Current Outpatient Medications  Medication Sig Dispense Refill   DULoxetine (CYMBALTA) 30 MG capsule Take 1 capsule (30 mg total) by mouth daily. 7 capsule 0   DULoxetine 40 MG CPEP Take 1 capsule (40 mg total) by mouth daily. 90 capsule 0   hydrOXYzine (ATARAX) 10 MG tablet Take 1 tablet (10 mg total) by mouth 3 (three) times daily as needed. 90 tablet 3   No current facility-administered medications for this visit.     Psychiatric Specialty Exam: Review of Systems  Psychiatric/Behavioral:  Positive for dysphoric mood. Negative for hallucinations, sleep disturbance and suicidal ideas.     There were no vitals taken for this visit.There is no height or weight on file to calculate BMI.  General Appearance: Fairly Groomed  Eye Contact:  Fair  Speech:  Clear and Coherent  Volume:  Normal  Mood: " Okay"  Affect:  Flat  Thought Process:  Coherent  Orientation:  Full (Time, Place, and Person)  Thought Content: Logical   Suicidal Thoughts:  No  Homicidal Thoughts:  No  Memory:  Immediate;   Good Recent;   Good  Judgement:  Fair  Insight:  Fair  Psychomotor Activity:  NA  Concentration:  Concentration: Fair  Recall:  NA  Fund of Knowledge: Good  Language: Good   Akathisia:  NA  Handed:    AIMS (if indicated): not done  Assets:  Communication Skills Desire for Improvement Housing Leisure Time Resilience Social Support  ADL's:  Intact  Cognition: WNL  Sleep:  Good   Screenings: GAD-7    Flowsheet Row Office Visit from 09/17/2022 in [facility]  Total GAD-7 Score 18      PHQ2-9    [place] Office Visit from 09/17/2022 in [place]  PHQ-2 Total Score 4  PHQ-9 Total Score 11      Flowsheet Row Counselor from 10/08/2022 in [place] No Risk        Assessment and Plan:   Based on assessment today patient appears less depressed than at last visit however she does appear to be more apathetic.  Some concern that patient transmitted May be contributing to this as she started this in the last 2 months.  We will continue to monitor, we will decrease patient's duloxetine to 40 mg due to apathy as well.  Will start seeing patient for dual medication management and therapy as she appears to have significant life change stressors right now including both loss of job and her father awaiting a heart transplant.  Patient appears to be trying her best to cope but this is becoming apathy which is concerning.  Patient's anxiety appears to be resolved at this time.  MDD, recurrent, moderate GAD-resolved - Decrease Cymbalta to 40 mg daily - Discontinue hydroxyzine 10 mg nightly as needed - For approximately 1 month for medication management will start therapy at this time, in person  ADHD - Patient receives Adderall 30 mg twice daily prescriptions from her PCP   Collaboration of Care: Collaboration of Care:   Patient/Guardian was advised Release of Information must be obtained prior to any record release in order to collaborate their care with an outside provider. Patient/Guardian was advised if they have not already done so to  contact the registration department to sign all necessary forms in order for [place] to release information regarding their care.   Consent: Patient/Guardian gives verbal consent for treatment and assignment of benefits for services provided during this visit. Patient/Guardian expressed understanding and agreed to proceed.   PGY-3 Freida Busman, MD 02/28/2023, 4:43 PM

## 2023-03-23 ENCOUNTER — Ambulatory Visit (HOSPITAL_COMMUNITY): Payer: Medicaid Other | Admitting: Student in an Organized Health Care Education/Training Program
